# Patient Record
Sex: Female | Born: 1975 | Race: Black or African American | Hispanic: No | Marital: Single | Smoking: Never smoker
Health system: Southern US, Community
[De-identification: ages and names within clinical notes are randomized; demographics above are authoritative.]

## PROBLEM LIST (undated history)

## (undated) DIAGNOSIS — J45909 Unspecified asthma, uncomplicated: Secondary | ICD-10-CM

## (undated) HISTORY — PX: TUBAL LIGATION: SHX77

## (undated) HISTORY — DX: Unspecified asthma, uncomplicated: J45.909

---

## 2020-07-20 ENCOUNTER — Emergency Department (HOSPITAL_COMMUNITY)
Admission: EM | Admit: 2020-07-20 | Discharge: 2020-07-20 | Disposition: A | Discharge status: Home / Self Care | Payer: No Typology Code available for payment source | Attending: Emergency Medicine | Admitting: Emergency Medicine

## 2020-07-20 ENCOUNTER — Other Ambulatory Visit: Payer: Self-pay

## 2020-07-20 ENCOUNTER — Encounter (HOSPITAL_COMMUNITY): Payer: Self-pay | Admitting: *Deleted

## 2020-07-20 ENCOUNTER — Emergency Department (HOSPITAL_COMMUNITY): Payer: No Typology Code available for payment source

## 2020-07-20 DIAGNOSIS — Z28310 Unvaccinated for covid-19: Secondary | ICD-10-CM | POA: Diagnosis not present

## 2020-07-20 DIAGNOSIS — J4521 Mild intermittent asthma with (acute) exacerbation: Secondary | ICD-10-CM | POA: Diagnosis not present

## 2020-07-20 DIAGNOSIS — R079 Chest pain, unspecified: Secondary | ICD-10-CM | POA: Diagnosis present

## 2020-07-20 MED ORDER — PREDNISONE 20 MG PO TABS: ORAL_TABLET | ORAL | 0 refills | Status: DC

## 2020-07-20 MED ORDER — ALBUTEROL SULFATE HFA 108 (90 BASE) MCG/ACT IN AERS
2.0000 | INHALATION_SPRAY | RESPIRATORY_TRACT | Status: DC | PRN
  Administered 2020-07-20: 2 via RESPIRATORY_TRACT
  Filled 2020-07-20: qty 6.7

## 2020-07-20 MED ORDER — PREDNISONE 20 MG PO TABS
60.0000 mg | ORAL_TABLET | Freq: Once | ORAL | Status: AC
  Administered 2020-07-20: 60 mg via ORAL
  Filled 2020-07-20: qty 3

## 2020-07-20 MED ORDER — BENZONATATE 100 MG PO CAPS: 100.0000 mg | ORAL_CAPSULE | Freq: Three times a day (TID) | ORAL | 0 refills | Status: AC

## 2020-07-20 NOTE — Discharge Instructions (Addendum)
You have symptom is likely due to an asthma exacerbation.  Take steroid as prescribed.  Take cough medication as needed.  Use albuterol inhaler 2 puffs every 4 hours as needed for shortness of breath.  A COVID test have been obtained, please check MyChart, link below for results.  Your chest x-ray today did not show any signs of pneumonia

## 2020-07-20 NOTE — ED Provider Notes (Signed)
[facility]-EMERGENCY DEPT Provider Note   CSN: [id number] Arrival date & time: 07/20/20  0840     History Chief Complaint  Patient presents with   Chest Pain   Wheezing    Terry Williams is a 45 y.o. female.  The history is provided by the patient. No language interpreter was used.  Chest Pain Wheezing Associated symptoms: chest pain    45 year old female significant history of chronic bronchitis who presents for evaluation of chest pain and wheezing.  Patient states the symptoms started this morning.  States she has been coughing, it hurts in her chest when she coughs she also endorsing wheezing.  Symptoms felt similar to prior asthma exacerbation that she had in the past.  She denies having fever chills congestion sore throat loss of taste or smell nausea vomiting diarrhea or significant shortness of breath.  She did try using her rescue inhaler today with minimal improvement.  She has not been vaccinated for COVID-19.  She denies any cardiac history  History reviewed. No pertinent past medical history.  There are no problems to display for this patient.   History reviewed. No pertinent surgical history.   OB History   No obstetric history on file.     No family history on file.     Home Medications Prior to Admission medications   Not on File    Allergies    Patient has no allergy information on record.  Review of Systems   Review of Systems  Respiratory:  Positive for wheezing.   Cardiovascular:  Positive for chest pain.  All other systems reviewed and are negative.  Physical Exam Updated Vital Signs BP (!) 141/100 (BP Location: Right Arm)   Pulse 73   Temp 99 F (37.2 C) (Oral)   Resp 18   Ht 5\' 1"  (1.549 m)   Wt 54.4 kg   SpO2 100%   BMI 22.67 kg/m   Physical Exam Vitals and nursing note reviewed.  Constitutional:      General: She is not in acute distress.    Appearance: She is well-developed.  HENT:     Head:  Atraumatic.     Nose: Nose normal.     Mouth/Throat:     Mouth: Mucous membranes are moist.  Eyes:     Conjunctiva/sclera: Conjunctivae normal.  Cardiovascular:     Rate and Rhythm: Normal rate and regular rhythm.  Pulmonary:     Effort: Pulmonary effort is normal.     Breath sounds: Wheezing (Faint wheeze heard) present.  Abdominal:     General: Abdomen is flat.     Tenderness: There is no abdominal tenderness.  Musculoskeletal:     Cervical back: Neck supple.  Skin:    Findings: No rash.  Neurological:     Mental Status: She is alert.  Psychiatric:        Mood and Affect: Mood normal.    ED Results / Procedures / Treatments   Labs (all labs ordered are listed, but only abnormal results are displayed) Labs Reviewed  BASIC METABOLIC PANEL  CBC  I-STAT BETA HCG BLOOD, ED ([facility], [facility], [facility] ONLY)  TROPONIN I (HIGH SENSITIVITY)    EKG None  Radiology No results found.  Procedures Procedures   Medications Ordered in ED Medications - No data to display  ED Course  I have reviewed the triage vital signs and the nursing notes.  Pertinent labs & imaging results that were available during my care of the patient were  reviewed by me and considered in my medical decision making (see chart for details).    MDM Rules/Calculators/A&P                          BP (!) 141/100 (BP Location: Right Arm)   Pulse 73   Temp 99 F (37.2 C) (Oral)   Resp 18   Ht 5\' 1"  (1.549 m)   Wt 54.4 kg   SpO2 100%   BMI 22.67 kg/m   Final Clinical Impression(s) / ED Diagnoses Final diagnoses:  Mild intermittent asthma with exacerbation    Rx / DC Orders ED Discharge Orders          Ordered    predniSONE (DELTASONE) 20 MG tablet        07/20/20 1048    benzonatate (TESSALON) 100 MG capsule  Every 8 hours        07/20/20 1048           9:45 AM Patient with history of asthma who is here with complaints of chest pain wheezes and persistent cough.  In the room she coughed multiple  times, and she does have some faint wheezes heard however she is not hypoxic and in no acute respiratory discomfort.  I suspect this is more likely to be asthma exacerbation.  I have very low suspicion for ACS or PE.  Will obtain COVID test as patient has not been vaccinated and is here with wheezing cough.  Anticipate discharging home with albuterol inhaler, steroids, cough medication and patient will need to follow-up on her COVID result   07/22/20, PA-C 07/20/20 1050    07/22/20, MD 07/20/20 [phone]

## 2020-07-20 NOTE — ED Triage Notes (Signed)
Pt complains of chest pain, left arm pain, and wheezing since this morning. Hx of chronic bronchitis.

## 2020-09-20 ENCOUNTER — Other Ambulatory Visit: Payer: Self-pay

## 2020-09-20 ENCOUNTER — Encounter (HOSPITAL_COMMUNITY): Payer: Self-pay

## 2020-09-20 ENCOUNTER — Emergency Department (HOSPITAL_COMMUNITY)
Admission: EM | Admit: 2020-09-20 | Discharge: 2020-09-20 | Disposition: A | Discharge status: Home / Self Care | Payer: No Typology Code available for payment source | Attending: Emergency Medicine | Admitting: Emergency Medicine

## 2020-09-20 DIAGNOSIS — M5412 Radiculopathy, cervical region: Secondary | ICD-10-CM | POA: Diagnosis not present

## 2020-09-20 DIAGNOSIS — M79602 Pain in left arm: Secondary | ICD-10-CM | POA: Diagnosis present

## 2020-09-20 MED ORDER — PREDNISONE 10 MG (21) PO TBPK: ORAL_TABLET | Freq: Every day | ORAL | 0 refills | Status: DC

## 2020-09-20 NOTE — ED Triage Notes (Signed)
Pt presents to the ED for left arm pain, burning, and numbness. Pt reports a hx of spinal stenosis and was prescribed Gabapentin for her sx and states they are "not working."

## 2020-09-20 NOTE — Discharge Instructions (Addendum)
I prescribed you a prednisone taper.  Please take this as prescribed.  When you are finished with this taper you can resume your 10 mg of daily prednisone for her asthma.  Please follow-up with Dr. Wilford Corner regarding your symptoms and work to schedule an MRI of your neck.  If you develop any worsening tingling, numbness, pain, weakness, please come back to the emergency department immediately for reevaluation.  It was a pleasure to meet you.

## 2020-09-20 NOTE — ED Provider Notes (Signed)
[facility]-EMERGENCY DEPT Provider Note   CSN: [id number] Arrival date & time: 09/20/20  1007     History Chief Complaint  Patient presents with   left arm pain    and numbness    Doriana Mazurkiewicz is a 45 y.o. female.  HPI Patient is a 45 year old female who presents to the emergency department due to burning pain and tingling in the bilateral arms, left greater than right.  Patient states that her symptoms have been intermittent for the past few months and yesterday she began experiencing more constant symptoms in the arm.  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 Bilateral mild neural foraminal narrowing. No acute fracture or dislocation seen.   She then followed up with Dr. Wilford Corner with orthopedics 4 days ago.  Patient had an MRI previously of her cervical spine and has known stenosis which is most significant at C5-6.  They are working to schedule a new MRI of the cervical spine and patient was started on gabapentin 300 mg twice daily.  Patient states she has been taking this medication without significant relief of her symptoms.  Denies any weakness or bowel/bladder incontinence.    History reviewed. No pertinent past medical history.  There are no problems to display for this patient.   History reviewed. No pertinent surgical history.   OB History   No obstetric history on file.     History reviewed. No pertinent family history.     Home Medications Prior to Admission medications   Medication Sig Start Date End Date Taking? Authorizing Provider  predniSONE (STERAPRED UNI-PAK 21 TAB) 10 MG (21) TBPK tablet Take by mouth daily. Take 6 tabs by mouth daily  for 2 days, then 5 tabs for 2 days, then 4 tabs for 2 days, then 3 tabs for 2 days, 2 tabs for 2 days, then 1 tab by mouth daily for 2 days  09/20/20  Yes Placido Sou, PA-C  benzonatate (TESSALON) 100 MG capsule Take 1 capsule (100 mg total) by mouth every 8 (eight) hours. 07/20/20   Fayrene Helper, PA-C    Allergies    Patient has no allergy information on record.  Review of Systems   Review of Systems  Musculoskeletal:  Positive for myalgias and neck pain.  Neurological:  Positive for numbness. Negative for weakness.   Physical Exam Updated Vital Signs BP (!) 145/109 (BP Location: Left Arm)   Pulse 70   Temp 97.9 F (36.6 C) (Oral)   Ht 5\' 1"  (1.549 m)   Wt 56.7 kg   SpO2 100%   BMI 23.62 kg/m   Physical Exam Vitals and nursing note reviewed.  Constitutional:      General: She is not in acute distress.    Appearance: She is well-developed.  HENT:     Head: Normocephalic and atraumatic.     Right Ear: External ear normal.     Left Ear: External ear normal.  Eyes:     General: No scleral icterus.       Right eye: No discharge.        Left eye: No discharge.     Conjunctiva/sclera: Conjunctivae normal.  Neck:     Trachea: No tracheal deviation.     Comments: Mild tenderness noted diffusely along the cervical spine that appears to be worst along the left lateral cervical  paraspinal musculature. Cardiovascular:     Rate and Rhythm: Normal rate.  Pulmonary:     Effort: Pulmonary effort is normal. No respiratory distress.     Breath sounds: No stridor.  Abdominal:     General: There is no distension.  Musculoskeletal:        General: No swelling or deformity.     Cervical back: Neck supple. Tenderness present.     Comments: Full range of motion of the bilateral upper extremities at the shoulders, elbows, and wrists.  Skin:    General: Skin is warm and dry.     Findings: No rash.  Neurological:     General: No focal deficit present.     Mental Status: She is alert and oriented to person, place, and time.     Cranial Nerves: Cranial nerve deficit: no gross deficits.     Comments: Strength is 5/5 in the  bilateral upper extremities.  Distal sensation intact to light touch.  Patient able to discriminate between sharp and dull.  Grip strength intact.  No gross deficits.    ED Results / Procedures / Treatments   Labs (all labs ordered are listed, but only abnormal results are displayed) Labs Reviewed - No data to display  EKG None  Radiology No results found.  Procedures Procedures   Medications Ordered in ED Medications - No data to display  ED Course  I have reviewed the triage vital signs and the nursing notes.  Pertinent labs & imaging results that were available during my care of the patient were reviewed by me and considered in my medical decision making (see chart for details).    MDM Rules/Calculators/A&P                          Patient is a 45 year old female who presents to the emergency department with what appears to be a cervical radiculopathy.  Patient evaluated by orthopedics 4 days ago and started on gabapentin twice daily which she states is not providing relief.  Physical exam significant for tenderness along the left lateral cervical spine.  Patient has full range of motion of the bilateral upper extremities.  Strength is intact bilaterally.  Distal sensation intact.  Patient able to discriminate between sharp and dull sensation.  No gross deficits on my exam.  We will start patient on a prednisone taper.  Recommended that she follow-up with orthopedics.  They are currently working to schedule an MRI of her cervical spine.  Patient understands to return to the emergency department with worsening weakness, numbness, bowel or bladder incontinence, or generally worsening symptoms.  Feel that she is stable for discharge at this time and she is agreeable.  Her questions were answered and she was amicable at the time of discharge.  Final Clinical Impression(s) / ED Diagnoses Final diagnoses:  Radiculopathy, unspecified spinal region   Rx / DC Orders ED Discharge  Orders          Ordered    predniSONE (STERAPRED UNI-PAK 21 TAB) 10 MG (21) TBPK tablet  Daily        09/20/20 1313             Placido Sou, PA-C 09/20/20 1323    Mancel Bale, MD 09/21/20 1054

## 2020-11-01 ENCOUNTER — Emergency Department (HOSPITAL_COMMUNITY)
Admission: EM | Admit: 2020-11-01 | Discharge: 2020-11-01 | Discharge status: Against Medical Advice | Payer: No Typology Code available for payment source | Attending: Emergency Medicine | Admitting: Emergency Medicine

## 2020-11-01 ENCOUNTER — Emergency Department (HOSPITAL_COMMUNITY): Payer: No Typology Code available for payment source

## 2020-11-01 ENCOUNTER — Other Ambulatory Visit: Payer: Self-pay

## 2020-11-01 ENCOUNTER — Encounter (HOSPITAL_COMMUNITY): Payer: Self-pay | Admitting: Emergency Medicine

## 2020-11-01 DIAGNOSIS — R0602 Shortness of breath: Secondary | ICD-10-CM | POA: Diagnosis not present

## 2020-11-01 DIAGNOSIS — R0789 Other chest pain: Secondary | ICD-10-CM | POA: Diagnosis not present

## 2020-11-01 LAB — CBC
HCT: 39.1 % (ref 36.0–46.0)
Hemoglobin: 12.6 g/dL (ref 12.0–15.0)
MCH: 27.9 pg (ref 26.0–34.0)
MCHC: 32.2 g/dL (ref 30.0–36.0)
MCV: 86.7 fL (ref 80.0–100.0)
Platelets: 308 10*3/uL (ref 150–400)
RBC: 4.51 MIL/uL (ref 3.87–5.11)
RDW: 13.2 % (ref 11.5–15.5)
WBC: 6.2 10*3/uL (ref 4.0–10.5)
nRBC: 0 % (ref 0.0–0.2)

## 2020-11-01 LAB — BASIC METABOLIC PANEL
Anion gap: 5 (ref 5–15)
BUN: 20 mg/dL (ref 6–20)
CO2: 25 mmol/L (ref 22–32)
Calcium: 9.4 mg/dL (ref 8.9–10.3)
Chloride: 107 mmol/L (ref 98–111)
Creatinine, Ser: 0.82 mg/dL (ref 0.44–1.00)
GFR, Estimated: >60 mL/min (ref >60)
Glucose, Bld: 189 mg/dL — ABNORMAL HIGH (ref 70–99)
Potassium: 3.6 mmol/L (ref 3.5–5.1)
Sodium: 137 mmol/L (ref 135–145)

## 2020-11-01 LAB — HCG, QUANTITATIVE, PREGNANCY: hCG, Beta Chain, Quant, S: 1 m[IU]/mL (ref <5)

## 2020-11-01 LAB — TROPONIN I (HIGH SENSITIVITY): Troponin I (High Sensitivity): 3 ng/L (ref <18)

## 2020-11-01 MED ORDER — ASPIRIN 81 MG PO CHEW: 324.0000 mg | CHEWABLE_TABLET | Freq: Once | ORAL | Status: DC

## 2020-11-01 NOTE — ED Notes (Signed)
Pt not in room.

## 2020-11-01 NOTE — ED Notes (Signed)
Pt is nowhere to be found 

## 2020-11-01 NOTE — ED Provider Notes (Signed)
[facility]-EMERGENCY DEPT Provider Note   CSN: [id number] Arrival date & time: 11/01/20  1704     History Chief Complaint  Patient presents with   Chest Pain   Shortness of Breath    Terry Williams is a 45 y.o. female.  She has no significant past medical history.  Complaining of sharp central chest pain and difficulty taking a deep breath that started about an hour prior to arrival.  She denies any trauma.  No history of having these symptoms before.  Denies any tobacco or drugs.  Has family history of heart disease but no personal history.  Has tried nothing for it.  The history is provided by the patient.  Chest Pain Pain location:  Substernal area Pain quality: sharp and stabbing   Pain radiates to:  Does not radiate Pain severity:  Severe Onset quality:  Sudden Duration:  3 hours Timing:  Constant Progression:  Unchanged Chronicity:  New Context: at rest   Relieved by:  None tried Worsened by:  Deep breathing and movement Ineffective treatments:  None tried Associated symptoms: shortness of breath   Associated symptoms: no abdominal pain, no back pain, no cough, no diaphoresis, no dysphagia, no fever, no headache, no lower extremity edema, no nausea and no vomiting   Shortness of Breath Associated symptoms: chest pain   Associated symptoms: no abdominal pain, no cough, no diaphoresis, no fever, no headaches, no rash, no sore throat and no vomiting    HPI: A 45 year old patient presents for evaluation of chest pain. Initial onset of pain was approximately 3-6 hours ago. The patient's chest pain is sharp and is not worse with exertion. The patient's chest pain is middle- or left-sided, is not well-localized, is not described as heaviness/pressure/tightness and does not radiate to the arms/jaw/neck. The patient does not complain of nausea and denies diaphoresis. The patient has a family history of coronary artery disease in a first-degree relative with  onset less than age 9. The patient has no history of stroke, has no history of peripheral artery disease, has not smoked in the past 90 days, denies any history of treated diabetes, is not hypertensive, has no history of hypercholesterolemia and does not have an elevated BMI (>=30).   History reviewed. No pertinent past medical history.  There are no problems to display for this patient.   History reviewed. No pertinent surgical history.   OB History   No obstetric history on file.     No family history on file.  Social History   Tobacco Use   Smoking status: Never   Smokeless tobacco: Never    Home Medications Prior to Admission medications   Medication Sig Start Date End Date Taking? Authorizing Provider  benzonatate (TESSALON) 100 MG capsule Take 1 capsule (100 mg total) by mouth every 8 (eight) hours. 07/20/20   Fayrene Helper, PA-C  predniSONE (STERAPRED UNI-PAK 21 TAB) 10 MG (21) TBPK tablet Take by mouth daily. Take 6 tabs by mouth daily  for 2 days, then 5 tabs for 2 days, then 4 tabs for 2 days, then 3 tabs for 2 days, 2 tabs for 2 days, then 1 tab by mouth daily for 2 days 09/20/20   Placido Sou, PA-C    Allergies    Patient has no allergy information on record.  Review of Systems   Review of Systems  Constitutional:  Negative for diaphoresis and fever.  HENT:  Negative for sore throat and trouble swallowing.   Eyes:  Negative for visual disturbance.  Respiratory:  Positive for shortness of breath. Negative for cough.   Cardiovascular:  Positive for chest pain.  Gastrointestinal:  Negative for abdominal pain, nausea and vomiting.  Genitourinary:  Negative for dysuria.  Musculoskeletal:  Negative for back pain.  Skin:  Negative for rash.  Neurological:  Negative for headaches.   Physical Exam Updated Vital Signs BP 136/76 (BP Location: Left Arm)   Pulse 67   Temp 98 F (36.7 C) (Oral)   Resp 16   SpO2 100%   Physical Exam Vitals and nursing note  reviewed.  Constitutional:      General: She is not in acute distress.    Appearance: She is well-developed.  HENT:     Head: Normocephalic and atraumatic.  Eyes:     Conjunctiva/sclera: Conjunctivae normal.  Cardiovascular:     Rate and Rhythm: Normal rate and regular rhythm.     Heart sounds: Normal heart sounds. No murmur heard. Pulmonary:     Effort: Pulmonary effort is normal. No respiratory distress.     Breath sounds: Normal breath sounds.  Chest:     Chest wall: No tenderness.  Abdominal:     Palpations: Abdomen is soft. There is no mass.     Tenderness: There is no abdominal tenderness.  Musculoskeletal:     Cervical back: Neck supple.  Skin:    General: Skin is warm and dry.  Neurological:     General: No focal deficit present.     Mental Status: She is alert.     Motor: No weakness.    ED Results / Procedures / Treatments   Labs (all labs ordered are listed, but only abnormal results are displayed) Labs Reviewed  BASIC METABOLIC PANEL - Abnormal; Notable for the following components:      Result Value   Glucose, Bld 189 (*)    All other components within normal limits  CBC  HCG, QUANTITATIVE, PREGNANCY  TROPONIN I (HIGH SENSITIVITY)  TROPONIN I (HIGH SENSITIVITY)    EKG EKG Interpretation  Date/Time:  Sunday November 01 2020 17:12:53 EDT Ventricular Rate:  68 PR Interval:  164 QRS Duration: 98 QT Interval:  394 QTC Calculation: 419 R Axis:   72 Text Interpretation: Sinus rhythm Probable left atrial enlargement Left ventricular hypertrophy No old tracing to compare Confirmed by Meridee Score [phone]) on 11/01/2020 5:15:27 PM  Radiology DG Chest 2 View  Result Date: 11/01/2020 CLINICAL DATA:  Chest pain and shortness of breath EXAM: CHEST - 2 VIEW COMPARISON:  07/20/2020 FINDINGS: The heart size and mediastinal contours are within normal limits. No focal airspace consolidation, pleural effusion, or pneumothorax. The visualized skeletal structures  are unremarkable. IMPRESSION: No active cardiopulmonary disease. Electronically Signed   By: Duanne Guess D.O.   On: 11/01/2020 17:38    Procedures Procedures   Medications Ordered in ED Medications  aspirin chewable tablet 324 mg (has no administration in time range)    ED Course  I have reviewed the triage vital signs and the nursing notes.  Pertinent labs & imaging results that were available during my care of the patient were reviewed by me and considered in my medical decision making (see chart for details).  Clinical Course as of 11/02/20 1035  Sun Nov 01, 2020  2009 I was informed by the nurse that the patient is not in the room.  Question eloped. [MB]    Clinical Course User Index [MB] Terrilee Files, MD   MDM Rules/Calculators/A&P [facility]  Score: 3                        This patient complains of chest pain shortness of breath; this involves an extensive number of treatment Options and is a complaint that carries with it a high risk of complications and Morbidity. The differential includes ACS, pneumonia, pneumothorax, PE, DVT, musculoskeletal, GERD  I ordered, reviewed and interpreted labs, which included CBC with normal white count normal hemoglobin, chemistries normal other than elevated glucose, pregnancy test negative, initial troponin negative.  I have ordered a D-dimer and troponin but these were not completed due to patient elopement I ordered imaging studies which included chest x-ray and I independently    visualized and interpreted imaging which showed no acute findings  Previous records obtained and reviewed in epic no recent visits  After the interventions stated above, I reevaluated the patient and found patient to have eloped.   Final Clinical Impression(s) / ED Diagnoses Final diagnoses:  Atypical chest pain    Rx / DC Orders ED Discharge Orders     None        Terrilee Files, MD 11/02/20 1038

## 2020-11-01 NOTE — ED Triage Notes (Signed)
Patient here from home reporting central chest pain non-radiating and SOB that started 30 min ago.

## 2020-11-01 NOTE — ED Notes (Signed)
Pt is not in the room, went in to see patient at 1910 to introduce self, pt not there, came back at 1930 still not there, pt is not in any of the bathrooms, called patient which did not answer phone, checked again at 2000 and pt is not there.

## 2020-11-01 NOTE — ED Provider Notes (Signed)
Emergency Medicine Provider Triage Evaluation Note  Terry Williams , a 45 y.o. female  was evaluated in triage.  Pt complains of chest pain and shortness of breath that started 30 minutes ago.  No prior cardiac history.  Patient states that the pain feels sharp and is in the middle of her chest and it does not radiate.  She also states that its been harder to catch her breath.  Never had similar symptoms before.  Review of Systems  Positive: CP, SOB Negative: Fevers, chills, abdominal pain, N/V  Physical Exam  BP 136/76 (BP Location: Left Arm)   Pulse 67   Temp 98 F (36.7 C) (Oral)   Resp 16   SpO2 100%  Gen:   Awake, no distress   Resp:  Normal effort  MSK:   Moves extremities without difficulty  Other:    Medical Decision Making  Medically screening exam initiated at 5:17 PM.  Appropriate orders placed.  Terry Williams was informed that the remainder of the evaluation will be completed by another provider, this initial triage assessment does not replace that evaluation, and the importance of remaining in the ED until their evaluation is complete.     Terry Williams 11/01/20 1718    Koleen Distance, MD 11/01/20 [phone]

## 2021-03-08 ENCOUNTER — Other Ambulatory Visit: Payer: Self-pay

## 2021-03-08 ENCOUNTER — Encounter: Payer: Self-pay | Admitting: Nurse Practitioner

## 2021-03-08 ENCOUNTER — Ambulatory Visit (INDEPENDENT_AMBULATORY_CARE_PROVIDER_SITE_OTHER): Payer: 59 | Admitting: Nurse Practitioner

## 2021-03-08 VITALS — BP 116/78 | HR 74 | Temp 98.2°F | Ht 61.0 in | Wt 116.1 lb

## 2021-03-08 DIAGNOSIS — Z7689 Persons encountering health services in other specified circumstances: Secondary | ICD-10-CM

## 2021-03-08 DIAGNOSIS — J4520 Mild intermittent asthma, uncomplicated: Secondary | ICD-10-CM | POA: Diagnosis not present

## 2021-03-08 DIAGNOSIS — M4722 Other spondylosis with radiculopathy, cervical region: Secondary | ICD-10-CM

## 2021-03-08 MED ORDER — PREDNISONE 10 MG (48) PO TBPK: ORAL_TABLET | ORAL | 0 refills | Status: DC

## 2021-03-08 MED ORDER — GABAPENTIN 100 MG PO CAPS: ORAL_CAPSULE | ORAL | 1 refills | Status: AC

## 2021-03-08 MED ORDER — ADVAIR HFA 45-21 MCG/ACT IN AERO: 2.0000 | INHALATION_SPRAY | Freq: Two times a day (BID) | RESPIRATORY_TRACT | 12 refills | Status: AC
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
## 2021-03-14 DIAGNOSIS — J4520 Mild intermittent asthma, uncomplicated: Secondary | ICD-10-CM | POA: Insufficient documentation

## 2021-03-14 DIAGNOSIS — M4722 Other spondylosis with radiculopathy, cervical region: Secondary | ICD-10-CM | POA: Insufficient documentation

## 2021-03-17 ENCOUNTER — Emergency Department (HOSPITAL_COMMUNITY)
Admission: EM | Admit: 2021-03-17 | Discharge: 2021-03-17 | Disposition: A | Discharge status: Home / Self Care | Payer: 59 | Attending: Emergency Medicine | Admitting: Emergency Medicine

## 2021-03-17 ENCOUNTER — Encounter (HOSPITAL_COMMUNITY): Payer: Self-pay

## 2021-03-17 DIAGNOSIS — R0600 Dyspnea, unspecified: Secondary | ICD-10-CM | POA: Diagnosis present

## 2021-03-17 DIAGNOSIS — J45909 Unspecified asthma, uncomplicated: Secondary | ICD-10-CM | POA: Insufficient documentation

## 2021-03-17 DIAGNOSIS — R0602 Shortness of breath: Secondary | ICD-10-CM | POA: Insufficient documentation

## 2021-03-17 DIAGNOSIS — R059 Cough, unspecified: Secondary | ICD-10-CM | POA: Diagnosis not present

## 2021-03-17 DIAGNOSIS — R000 Tachycardia, unspecified: Secondary | ICD-10-CM | POA: Insufficient documentation

## 2021-03-17 DIAGNOSIS — R062 Wheezing: Secondary | ICD-10-CM | POA: Diagnosis not present

## 2021-03-17 DIAGNOSIS — Z7952 Long term (current) use of systemic steroids: Secondary | ICD-10-CM | POA: Insufficient documentation

## 2021-03-17 DIAGNOSIS — Z20822 Contact with and (suspected) exposure to covid-19: Secondary | ICD-10-CM | POA: Insufficient documentation

## 2021-03-17 LAB — RESP PANEL BY RT-PCR (FLU A&B, COVID) ARPGX2
Influenza A by PCR: NEGATIVE
Influenza B by PCR: NEGATIVE
SARS Coronavirus 2 by RT PCR: NEGATIVE

## 2021-03-17 MED ORDER — PREDNISONE 20 MG PO TABS: 40.0000 mg | ORAL_TABLET | Freq: Every day | ORAL | 0 refills | Status: AC

## 2021-03-17 MED ORDER — ALBUTEROL SULFATE HFA 108 (90 BASE) MCG/ACT IN AERS
2.0000 | INHALATION_SPRAY | Freq: Four times a day (QID) | RESPIRATORY_TRACT | Status: DC
  Administered 2021-03-17: 2 via RESPIRATORY_TRACT
  Filled 2021-03-17: qty 6.7

## 2021-03-17 MED ORDER — PREDNISONE 20 MG PO TABS
60.0000 mg | ORAL_TABLET | ORAL | Status: AC
  Administered 2021-03-17: 60 mg via ORAL
  Filled 2021-03-17: qty 3

## 2021-03-17 MED ORDER — IPRATROPIUM-ALBUTEROL 0.5-2.5 (3) MG/3ML IN SOLN
3.0000 mL | Freq: Once | RESPIRATORY_TRACT | Status: AC
  Administered 2021-03-17: 3 mL via RESPIRATORY_TRACT
  Filled 2021-03-17: qty 3

## 2021-03-17 NOTE — ED Provider Notes (Signed)
[facility]-EMERGENCY DEPT Provider Note   CSN: [id number] Arrival date & time: 03/17/21  1113     History  Chief Complaint  Patient presents with   Cough   Wheezing    Terry Williams is a 46 y.o. female.  HPI Patient with a history of asthma presents with dyspnea, wheezing, cough.  She is a non-smoker, notes that she has nearly completed a taper of steroids, has been using her home inhaler as instructed, and had been improving after an exacerbation about 1 week ago.  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  No pain, fever, vomiting, nausea or other complaints.    Home Medications Prior to Admission medications   Medication Sig Start Date End Date Taking? Authorizing Provider  benzonatate (TESSALON) 100 MG capsule Take 1 capsule (100 mg total) by mouth every 8 (eight) hours. 07/20/20   Fayrene Helper, PA-C  fluticasone-salmeterol (ADVAIR HFA) [phone] MCG/ACT inhaler Inhale 2 puffs into the lungs 2 (two) times daily. 03/08/21   Carlean Jews, NP  gabapentin (NEURONTIN) 100 MG capsule May take 1 capsule po QHS at bedtime for one week then  increase to 2 capsules po QHS 03/08/21   Carlean Jews, NP  predniSONE (STERAPRED UNI-PAK 48 TAB) 10 MG (48) TBPK tablet 12 day taper - take by mouth as directed for 12 days 03/08/21   Carlean Jews, NP      Allergies    Promethazine and Naproxen    Review of Systems   Review of Systems  Constitutional:        Per HPI, otherwise negative  HENT:         Per HPI, otherwise negative  Respiratory:         Per HPI, otherwise negative  Cardiovascular:        Per HPI, otherwise negative  Gastrointestinal:  Negative for vomiting.  Endocrine:       Negative aside from HPI  Genitourinary:        Neg aside from HPI   Musculoskeletal:        Per HPI, otherwise negative  Skin: Negative.   Neurological:  Negative for syncope.   Physical  Exam Updated Vital Signs BP (!) 145/97 (BP Location: Right Arm)    Pulse 78    Temp 98.4 F (36.9 C) (Oral)    Resp 18    SpO2 100%  Physical Exam Vitals and nursing note reviewed.  Constitutional:      General: She is not in acute distress.    Appearance: She is well-developed.  HENT:     Head: Normocephalic and atraumatic.  Eyes:     Conjunctiva/sclera: Conjunctivae normal.  Cardiovascular:     Rate and Rhythm: Regular rhythm. Tachycardia present.  Pulmonary:     Breath sounds: Decreased air movement present.  Abdominal:     General: There is no distension.  Skin:    General: Skin is warm and dry.  Neurological:     Mental Status: She is alert and oriented to person, place, and time.     Cranial Nerves: No cranial nerve deficit.    ED Results / Procedures / Treatments   Labs (all labs ordered are listed, but only abnormal results are displayed) Labs Reviewed  RESP PANEL BY RT-PCR (FLU A&B, COVID) [id number]    EKG None  Radiology No results found.  Procedures Procedures    Medications Ordered  in ED Medications  ipratropium-albuterol (DUONEB) 0.5-2.5 (3) MG/3ML nebulizer solution 3 mL (has no administration in time range)  predniSONE (DELTASONE) tablet 60 mg (has no administration in time range)    ED Course/ Medical Decision Making/ A&P  This patient presents to the ED for concern of dyspnea, cough, this involves an extensive number of treatment options, and is a complaint that carries with it a high risk of complications and morbidity.  The differential diagnosis includes COVID, asthma exacerbation, pneumonia, bacteremia, sepsis   Co morbidities that complicate the patient evaluation  Asthma   Social Determinants of Health:  None   Additional history obtained:  Additional history and/or information obtained from chart review External records from outside source obtained and reviewed including Primary care visit note from end of last month with  initiation of new primary care, reassuring   After the initial evaluation, orders, including: Bronchodilator, COVID test, steroids were initiated.  Patient placed on Cardiac and Pulse-Oximetry Monitors. The patient was maintained on a cardiac monitor.  The cardiac monitored showed an rhythm of sinus 80 unremarkable The patient was also maintained on pulse oximetry. The readings were typically 96% room air borderline  On repeat evaluation of the patient improved  Lab Tests:  I personally interpreted labs.  The pertinent results include: Negative COVID test negative influenza    Dispostion / Final MDM:  After consideration of the diagnostic results and the patient's response to treatment, patient is appropriate for discharge with increased steroid dosing, scheduled bronchodilator use, outpatient follow-up.  No early for bacteremia, sepsis, low suspicion for pneumonia given the palpable breath sounds bilaterally, absence of fever.  Patient received first dose of his steroids in the ED after DuoNeb, and was provided albuterol inhaler as she was seemingly only on Advair to go with her steroids previously.   Final Clinical Impression(s) / ED Diagnoses Final diagnoses:  SOB (shortness of breath)    Rx / DC Orders ED Discharge Orders          Ordered    predniSONE (DELTASONE) 20 MG tablet  Daily with breakfast        03/17/21 1345              Gerhard Munch, MD 03/17/21 1345

## 2021-03-17 NOTE — ED Triage Notes (Signed)
Pt presents with c/o cough and wheezing. Pt does have asthma, reports that she believes her bronchitis may be flaring up.

## 2021-03-17 NOTE — Discharge Instructions (Addendum)
As discussed, your evaluation today has been largely reassuring.  But, it is important that you monitor your condition carefully, and do not hesitate to return to the ED if you develop new, or concerning changes in your condition.  Otherwise, please follow-up with your physician for appropriate ongoing care.  For the next 2 days please use the provided albuterol inhaler every 4 hours in addition to your Advair.  You may then use the albuterol as needed for additional relief.  Please obtain and take your new dose of steroids and discontinue the Sterapred Unipak.

## 2021-03-24 ENCOUNTER — Other Ambulatory Visit: Payer: Self-pay

## 2021-03-24 ENCOUNTER — Ambulatory Visit (HOSPITAL_BASED_OUTPATIENT_CLINIC_OR_DEPARTMENT_OTHER): Payer: Self-pay | Admitting: Family Medicine

## 2021-03-24 DIAGNOSIS — Z130 Encounter for screening for diseases of the blood and blood-forming organs and certain disorders involving the immune mechanism: Secondary | ICD-10-CM

## 2021-03-24 DIAGNOSIS — Z7689 Persons encountering health services in other specified circumstances: Secondary | ICD-10-CM

## 2021-03-24 DIAGNOSIS — Z13228 Encounter for screening for other metabolic disorders: Secondary | ICD-10-CM

## 2021-03-26 ENCOUNTER — Other Ambulatory Visit: Payer: 59

## 2021-04-02 ENCOUNTER — Encounter: Payer: 59 | Admitting: Nurse Practitioner

## 2021-04-16 ENCOUNTER — Encounter (HOSPITAL_BASED_OUTPATIENT_CLINIC_OR_DEPARTMENT_OTHER): Payer: Self-pay | Admitting: Family Medicine

## 2021-05-25 ENCOUNTER — Emergency Department (HOSPITAL_BASED_OUTPATIENT_CLINIC_OR_DEPARTMENT_OTHER): Payer: 59 | Admitting: Radiology

## 2021-05-25 ENCOUNTER — Other Ambulatory Visit: Payer: Self-pay

## 2021-05-25 ENCOUNTER — Emergency Department (HOSPITAL_BASED_OUTPATIENT_CLINIC_OR_DEPARTMENT_OTHER)
Admission: EM | Admit: 2021-05-25 | Discharge: 2021-05-25 | Disposition: A | Discharge status: Home / Self Care | Payer: 59 | Attending: Emergency Medicine | Admitting: Emergency Medicine

## 2021-05-25 ENCOUNTER — Encounter (HOSPITAL_BASED_OUTPATIENT_CLINIC_OR_DEPARTMENT_OTHER): Payer: Self-pay | Admitting: Obstetrics and Gynecology

## 2021-05-25 DIAGNOSIS — J4521 Mild intermittent asthma with (acute) exacerbation: Secondary | ICD-10-CM | POA: Insufficient documentation

## 2021-05-25 DIAGNOSIS — J40 Bronchitis, not specified as acute or chronic: Secondary | ICD-10-CM | POA: Insufficient documentation

## 2021-05-25 DIAGNOSIS — Z7951 Long term (current) use of inhaled steroids: Secondary | ICD-10-CM | POA: Diagnosis not present

## 2021-05-25 DIAGNOSIS — R079 Chest pain, unspecified: Secondary | ICD-10-CM | POA: Diagnosis present

## 2021-05-25 LAB — TROPONIN I (HIGH SENSITIVITY)
Troponin I (High Sensitivity): 2 ng/L (ref <18)
Troponin I (High Sensitivity): <2 ng/L (ref <18)

## 2021-05-25 LAB — BASIC METABOLIC PANEL
Anion gap: 7 (ref 5–15)
BUN: 12 mg/dL (ref 6–20)
CO2: 28 mmol/L (ref 22–32)
Calcium: 10 mg/dL (ref 8.9–10.3)
Chloride: 103 mmol/L (ref 98–111)
Creatinine, Ser: 1.05 mg/dL — ABNORMAL HIGH (ref 0.44–1.00)
GFR, Estimated: >60 mL/min (ref >60)
Glucose, Bld: 178 mg/dL — ABNORMAL HIGH (ref 70–99)
Potassium: 4.3 mmol/L (ref 3.5–5.1)
Sodium: 138 mmol/L (ref 135–145)

## 2021-05-25 LAB — CBC
HCT: 38 % (ref 36.0–46.0)
Hemoglobin: 12.4 g/dL (ref 12.0–15.0)
MCH: 27.5 pg (ref 26.0–34.0)
MCHC: 32.6 g/dL (ref 30.0–36.0)
MCV: 84.3 fL (ref 80.0–100.0)
Platelets: 305 10*3/uL (ref 150–400)
RBC: 4.51 MIL/uL (ref 3.87–5.11)
RDW: 12.9 % (ref 11.5–15.5)
WBC: 4.7 10*3/uL (ref 4.0–10.5)
nRBC: 0 % (ref 0.0–0.2)

## 2021-05-25 MED ORDER — DEXAMETHASONE 4 MG PO TABS
10.0000 mg | ORAL_TABLET | Freq: Once | ORAL | Status: AC
  Administered 2021-05-25: 10 mg via ORAL
  Filled 2021-05-25: qty 3

## 2021-05-25 MED ORDER — ALBUTEROL SULFATE (2.5 MG/3ML) 0.083% IN NEBU
5.0000 mg | INHALATION_SOLUTION | Freq: Once | RESPIRATORY_TRACT | Status: AC
  Administered 2021-05-25: 5 mg via RESPIRATORY_TRACT
  Filled 2021-05-25: qty 6

## 2021-05-25 MED ORDER — BENZONATATE 100 MG PO CAPS
100.0000 mg | ORAL_CAPSULE | Freq: Once | ORAL | Status: AC
Start: 2021-05-25 — End: 2021-05-25
  Administered 2021-05-25: 100 mg via ORAL
  Filled 2021-05-25: qty 1

## 2021-05-25 NOTE — ED Provider Notes (Signed)
?[facility] EMERGENCY DEPT ?Provider Note ? ? ?CSN: [id number] ?Arrival date & time: 05/25/21  1059 ? ?  ? ?History ? ?Chief Complaint  ?Patient presents with  ? Chest Pain  ? ? ?Terry Williams is a 46 y.o. female. ? ? ?Chest Pain ?Associated symptoms: cough and shortness of breath   ?Patient presents for cough and shortness of breath since yesterday.  She has a history of asthma and has been using her inhalers at home with some relief.  She endorses chest pain with coughing.  She denies any radiation of the pain.  She denies any associated nausea, vomiting, or diarrhea.  She has not had any fevers. ?HPI: A 46 year old patient presents for evaluation of chest pain. Initial onset of pain was less than one hour ago. The patient's chest pain is sharp and is not worse with exertion. The patient's chest pain is middle- or left-sided, is not well-localized, is not described as heaviness/pressure/tightness and does not radiate to the arms/jaw/neck. The patient does not complain of nausea and denies diaphoresis. The patient has a family history of coronary artery disease in a first-degree relative with onset less than age 61. The patient has no history of stroke, has no history of peripheral artery disease, has not smoked in the past 90 days, denies any history of treated diabetes, is not hypertensive, has no history of hypercholesterolemia and does not have an elevated BMI (>=30).  ? ?Home Medications ?Prior to Admission medications   ?Medication Sig Start Date End Date Taking? Authorizing Provider  ?benzonatate (TESSALON) 100 MG capsule Take 1 capsule (100 mg total) by mouth every 8 (eight) hours. 07/20/20   Domenic Moras, PA-C  ?fluticasone-salmeterol (ADVAIR HFA) [phone] MCG/ACT inhaler Inhale 2 puffs into the lungs 2 (two) times daily. 03/08/21   Ronnell Freshwater, NP  ?gabapentin (NEURONTIN) 100 MG capsule May take 1 capsule po QHS at bedtime for one week then  increase to 2 capsules po QHS 03/08/21   Ronnell Freshwater, NP  ?meloxicam (MOBIC) 15 MG tablet Take 15 mg by mouth daily. 04/19/21   [provider]  ?predniSONE (DELTASONE) 20 MG tablet Take 2 tablets (40 mg total) by mouth daily with breakfast. For the next four days 03/17/21   Carmin Muskrat, MD  ?   ? ?Allergies    ?Aspirin, Promethazine, and Naproxen   ? ?Review of Systems   ?Review of Systems  ?Respiratory:  Positive for cough, chest tightness and shortness of breath.   ?Cardiovascular:  Positive for chest pain.  ?All other systems reviewed and are negative. ? ?Physical Exam ?Updated Vital Signs ?BP 122/76 (BP Location: Right Arm)   Pulse 61   Temp 98.2 ?F (36.8 ?C) (Oral)   Resp 18   Ht 5\' 1"  (1.549 m)   Wt 54.4 kg   SpO2 100%   BMI 22.67 kg/m?  ?Physical Exam ?Vitals and nursing note reviewed.  ?Constitutional:   ?   General: She is not in acute distress. ?   Appearance: She is well-developed. She is not ill-appearing, toxic-appearing or diaphoretic.  ?HENT:  ?   Head: Normocephalic and atraumatic.  ?Eyes:  ?   Conjunctiva/sclera: Conjunctivae normal.  ?Cardiovascular:  ?   Rate and Rhythm: Normal rate and regular rhythm.  ?   Heart sounds: No murmur heard. ?Pulmonary:  ?   Effort: Pulmonary effort is normal. No respiratory distress.  ?   Breath sounds: Wheezing (Slight end expiratory) present. No decreased breath sounds, rhonchi or rales.  ?  Chest:  ?   Chest wall: Tenderness present.  ?Abdominal:  ?   Palpations: Abdomen is soft.  ?   Tenderness: There is no abdominal tenderness.  ?Musculoskeletal:     ?   General: No swelling.  ?   Cervical back: Normal range of motion and neck supple.  ?   Right lower leg: No edema.  ?   Left lower leg: No edema.  ?Skin: ?   General: Skin is warm and dry.  ?   Capillary Refill: Capillary refill takes less than 2 seconds.  ?   Coloration: Skin is not cyanotic or pale.  ?Neurological:  ?   General: No focal deficit present.  ?   Mental Status: She is alert and oriented to person, place, and time.  ?    Cranial Nerves: No cranial nerve deficit.  ?   Motor: No weakness.  ?Psychiatric:     ?   Mood and Affect: Mood normal.     ?   Behavior: Behavior normal.  ? ? ?ED Results / Procedures / Treatments   ?Labs ?(all labs ordered are listed, but only abnormal results are displayed) ?Labs Reviewed  ?BASIC METABOLIC PANEL - Abnormal; Notable for the following components:  ?    Result Value  ? Glucose, Bld 178 (*)   ? Creatinine, Ser 1.05 (*)   ? All other components within normal limits  ?CBC  ?TROPONIN I (HIGH SENSITIVITY)  ?TROPONIN I (HIGH SENSITIVITY)  ? ? ?EKG ?None ? ?Radiology ?DG Chest 2 View ? ?Result Date: 05/25/2021 ?CLINICAL DATA:  Chest pain. EXAM: CHEST - 2 VIEW COMPARISON:  November 01, 2020. FINDINGS: The heart size and mediastinal contours are within normal limits. Both lungs are clear. The visualized skeletal structures are unremarkable. IMPRESSION: No active cardiopulmonary disease. Electronically Signed   By: Marijo Conception M.D.   On: 05/25/2021 11:37   ? ?Procedures ?Procedures  ? ? ?Medications Ordered in ED ?Medications  ?albuterol (PROVENTIL) (2.5 MG/3ML) 0.083% nebulizer solution 5 mg (5 mg Nebulization Given 05/25/21 1242)  ?dexamethasone (DECADRON) tablet 10 mg (10 mg Oral Given 05/25/21 1258)  ?benzonatate (TESSALON) capsule 100 mg (100 mg Oral Given 05/25/21 1258)  ? ? ?ED Course/ Medical Decision Making/ A&P ?  ?HEAR Score: 2                       ?Medical Decision Making ?Amount and/or Complexity of Data Reviewed ?Labs: ordered. ?Radiology: ordered. ? ?Risk ?Prescription drug management. ? ? ?46 year old female presenting for cough and shortness of breath since yesterday.  She also endorses chest tightness and a middle chest pain that is present with coughing.  She has utilized inhalers at home with relief.  She has not taken anything for suppression of cough.  Differential diagnosis includes asthma exacerbation, bronchitis, pneumonia, PE, ACS.  EKG was obtained which did not show any concerns  of ST segment changes.  Based on her symptoms, risk factors, and EKG, patient's heart score is 2.  I suspect that her symptoms are secondary to her cough.  On exam, she does have a slight end of the release.  Presentation is consistent with asthma exacerbation.  Patient was given Decadron and nebulized albuterol with resolution of symptoms.  Lab results, including troponins x2, are normal.  Chest x-ray shows no acute findings.  Given her improved symptoms and reassuring work-up, patient is stable for discharge at this time. ? ? ? ? ? ? ? ?Final Clinical  Impression(s) / ED Diagnoses ?Final diagnoses:  ?Bronchitis  ?Mild intermittent asthma with exacerbation  ? ? ?Rx / DC Orders ?ED Discharge Orders   ? ? None  ? ?  ? ? ?  ?Godfrey Pick, MD ?05/26/21 [phone] ? ?

## 2021-05-25 NOTE — ED Triage Notes (Signed)
Patient reports to the ER for sharp pains in her chest. Patient reports a hx of bronchitis and reports this may be related.  ?

## 2021-07-22 ENCOUNTER — Emergency Department (HOSPITAL_COMMUNITY)
Admission: EM | Admit: 2021-07-22 | Discharge: 2021-07-22 | Discharge status: Against Medical Advice | Payer: 59 | Attending: Emergency Medicine | Admitting: Emergency Medicine

## 2021-07-22 ENCOUNTER — Other Ambulatory Visit: Payer: Self-pay

## 2021-07-22 ENCOUNTER — Ambulatory Visit (HOSPITAL_COMMUNITY)
Admission: RE | Admit: 2021-07-22 | Discharge: 2021-07-22 | Disposition: A | Discharge status: Home / Self Care | Payer: 59 | Source: Ambulatory Visit | Attending: Emergency Medicine | Admitting: Emergency Medicine

## 2021-07-22 ENCOUNTER — Ambulatory Visit (HOSPITAL_COMMUNITY)
Admission: EM | Admit: 2021-07-22 | Discharge: 2021-07-22 | Disposition: A | Discharge status: Home / Self Care | Payer: 59 | Attending: Emergency Medicine | Admitting: Emergency Medicine

## 2021-07-22 ENCOUNTER — Encounter (HOSPITAL_COMMUNITY): Payer: Self-pay

## 2021-07-22 ENCOUNTER — Encounter (HOSPITAL_COMMUNITY): Payer: Self-pay | Admitting: Emergency Medicine

## 2021-07-22 DIAGNOSIS — Z5321 Procedure and treatment not carried out due to patient leaving prior to being seen by health care provider: Secondary | ICD-10-CM | POA: Diagnosis not present

## 2021-07-22 DIAGNOSIS — F329 Major depressive disorder, single episode, unspecified: Secondary | ICD-10-CM | POA: Insufficient documentation

## 2021-07-22 DIAGNOSIS — M79642 Pain in left hand: Secondary | ICD-10-CM

## 2021-07-22 DIAGNOSIS — R45851 Suicidal ideations: Secondary | ICD-10-CM | POA: Insufficient documentation

## 2021-07-22 DIAGNOSIS — M25532 Pain in left wrist: Secondary | ICD-10-CM | POA: Diagnosis not present

## 2021-07-22 DIAGNOSIS — M7918 Myalgia, other site: Secondary | ICD-10-CM | POA: Diagnosis not present

## 2021-07-22 DIAGNOSIS — S63502A Unspecified sprain of left wrist, initial encounter: Secondary | ICD-10-CM

## 2021-07-22 NOTE — ED Triage Notes (Addendum)
Pt c/o pain in left hand/wrist x 1 month. States pain has worsened recently.  Pt denies injury.  Pt states she has suicidal ideations, but feels like it is her usual depression.

## 2021-07-22 NOTE — Discharge Instructions (Signed)
Rest,ice, wear ace wrap or wrist brace of your choice for support. Your xrays today at ER were both negative. Take otc med for pain management(tylenol,voltaren gel,lidocaine cream,etc). Follow up with PCP ,if symptoms persist follow up with Orthopedist.

## 2021-07-22 NOTE — ED Notes (Signed)
Called for patient again and no answer.

## 2021-07-22 NOTE — ED Triage Notes (Signed)
Pt is present today with left wrist pain. Pt states that the pain started one month ago. Pt denies any injury

## 2021-07-22 NOTE — ED Provider Notes (Signed)
[facility]    CSN: [id number] Arrival date & time: 07/22/21  0806      History   Chief Complaint Chief Complaint  Patient presents with   Wrist Pain    HPI Terry Williams is a 46 y.o. female.   46 year old female pt, Terry Williams presents to Urgent Care with chief complaint of left wrist/hand pain x 1 month. No trauma or injury. Pt was in ER PTA had xrays done, left ER and is now checked in at Urgent Care. Xrays were negative from today's ER visit.   The history is provided by the patient. No language interpreter was used.    Past Medical History:  Diagnosis Date   Asthma     Patient Active Problem List   Diagnosis Date Noted   Sprain of left wrist 07/22/2021   Left hand pain 07/22/2021   Left wrist pain 07/22/2021   Musculoskeletal pain 07/22/2021   Osteoarthritis of spine with radiculopathy, cervical region 03/14/2021   Mild intermittent asthma without complication 03/14/2021    Past Surgical History:  Procedure Laterality Date   TUBAL LIGATION      OB History   No obstetric history on file.      Home Medications    Prior to Admission medications   Medication Sig Start Date End Date Taking? Authorizing Provider  benzonatate (TESSALON) 100 MG capsule Take 1 capsule (100 mg total) by mouth every 8 (eight) hours. 07/20/20   Fayrene Helper, PA-C  fluticasone-salmeterol (ADVAIR HFA) [phone] MCG/ACT inhaler Inhale 2 puffs into the lungs 2 (two) times daily. 03/08/21   Carlean Jews, NP  gabapentin (NEURONTIN) 100 MG capsule May take 1 capsule po QHS at bedtime for one week then  increase to 2 capsules po QHS 03/08/21   Carlean Jews, NP  meloxicam (MOBIC) 15 MG tablet Take 15 mg by mouth daily. 04/19/21   [provider]  predniSONE (DELTASONE) 20 MG tablet Take 2 tablets (40 mg total) by mouth daily with breakfast. For the next four days 03/17/21   Gerhard Munch, MD    Family History History reviewed. No pertinent family  history.  Social History Social History   Tobacco Use   Smoking status: Never   Smokeless tobacco: Never  Vaping Use   Vaping Use: Never used  Substance Use Topics   Alcohol use: Not Currently   Drug use: Never     Allergies   Aspirin, Promethazine, and Naproxen   Review of Systems Review of Systems  Constitutional:  Negative for fever.  Musculoskeletal:  Positive for arthralgias and myalgias. Negative for back pain, gait problem, joint swelling, neck pain and neck stiffness.  All other systems reviewed and are negative.    Physical Exam Triage Vital Signs ED Triage Vitals  Enc Vitals Group     BP 07/22/21 0855 (!) 120/57     Pulse Rate 07/22/21 0855 78     Resp 07/22/21 0855 17     Temp 07/22/21 0855 98.2 F (36.8 C)     Temp src --      SpO2 07/22/21 0855 98 %     Weight --      Height --      Head Circumference --      Peak Flow --      Pain Score 07/22/21 0857 10     Pain Loc --      Pain Edu? --      Excl. in GC? --  No data found.  Updated Vital Signs BP (!) 120/57   Pulse 78   Temp 98.2 F (36.8 C)   Resp 17   LMP  (LMP Unknown)   SpO2 98%   Visual Acuity Right Eye Distance:   Left Eye Distance:   Bilateral Distance:    Right Eye Near:   Left Eye Near:    Bilateral Near:     Physical Exam Vitals and nursing note reviewed.  Constitutional:      General: She is not in acute distress.    Appearance: She is well-developed and well-groomed.  HENT:     Head: Normocephalic and atraumatic.  Eyes:     Conjunctiva/sclera: Conjunctivae normal.  Cardiovascular:     Rate and Rhythm: Normal rate and regular rhythm.     Heart sounds: No murmur heard. Pulmonary:     Effort: Pulmonary effort is normal. No respiratory distress.     Breath sounds: Normal breath sounds.  Abdominal:     Palpations: Abdomen is soft.     Tenderness: There is no abdominal tenderness.  Musculoskeletal:        General: No swelling.     Left wrist: Tenderness  present.       Arms:     Cervical back: Neck supple.  Skin:    General: Skin is warm and dry.     Capillary Refill: Capillary refill takes less than 2 seconds.  Neurological:     General: No focal deficit present.     Mental Status: She is alert and oriented to person, place, and time.     GCS: GCS eye subscore is 4. GCS verbal subscore is 5. GCS motor subscore is 6.  Psychiatric:        Attention and Perception: Attention normal.        Mood and Affect: Mood normal.        Speech: Speech normal.        Behavior: Behavior normal. Behavior is cooperative.      UC Treatments / Results  Labs (all labs ordered are listed, but only abnormal results are displayed) Labs Reviewed - No data to display  EKG   Radiology DG Wrist Complete Left  Result Date: 07/22/2021 CLINICAL DATA:  Left hand and wrist trauma. EXAM: LEFT WRIST - COMPLETE 3+ VIEW; LEFT HAND - COMPLETE 3+ VIEW COMPARISON:  None Available. FINDINGS: Left hand: There is no evidence of fracture or dislocation. There is no evidence of arthropathy or other focal bone abnormality. Soft tissues are unremarkable. Left wrist: There is normal bone mineralization. There is no evidence of fracture, dislocation or arthritic changes. Soft tissues are unremarkable. IMPRESSION: No evidence of fractures. Electronically Signed   By: Almira Bar M.D.   On: 07/22/2021 07:16   DG Hand Complete Left  Result Date: 07/22/2021 CLINICAL DATA:  Left hand and wrist trauma. EXAM: LEFT WRIST - COMPLETE 3+ VIEW; LEFT HAND - COMPLETE 3+ VIEW COMPARISON:  None Available. FINDINGS: Left hand: There is no evidence of fracture or dislocation. There is no evidence of arthropathy or other focal bone abnormality. Soft tissues are unremarkable. Left wrist: There is normal bone mineralization. There is no evidence of fracture, dislocation or arthritic changes. Soft tissues are unremarkable. IMPRESSION: No evidence of fractures. Electronically Signed   By: Almira Bar M.D.   On: 07/22/2021 07:16    Procedures Procedures (including critical care time)  Medications Ordered in UC Medications - No data to display  Initial Impression /  Assessment and Plan / UC Course  I have reviewed the triage vital signs and the nursing notes.  Pertinent labs & imaging results that were available during my care of the patient were reviewed by me and considered in my medical decision making (see chart for details).     Ddx: Wrist/hand pain, sprain, strain Final Clinical Impressions(s) / UC Diagnoses   Final diagnoses:  Left hand pain  Left wrist pain  Musculoskeletal pain     Discharge Instructions      Rest,ice, wear ace wrap or wrist brace of your choice for support. Your xrays today at ER were both negative. Take otc med for pain management(tylenol,voltaren gel,lidocaine cream,etc). Follow up with PCP ,if symptoms persist follow up with Orthopedist.      ED Prescriptions   None    PDMP not reviewed this encounter.   Clancy Gourd, NP 07/22/21 1006

## 2021-07-22 NOTE — ED Notes (Signed)
Pt was called to bring back to triage for SI and to change into scrubs. Unable to locate pt att. Called xray and pt not there.

## 2022-08-12 IMAGING — DX DG CHEST 2V
2 series · 2 of 2 positions shown · non-contrast
Comparison: November 01, 2020.

CLINICAL DATA: Chest pain.

EXAM:
CHEST - 2 VIEW

[chest pa]
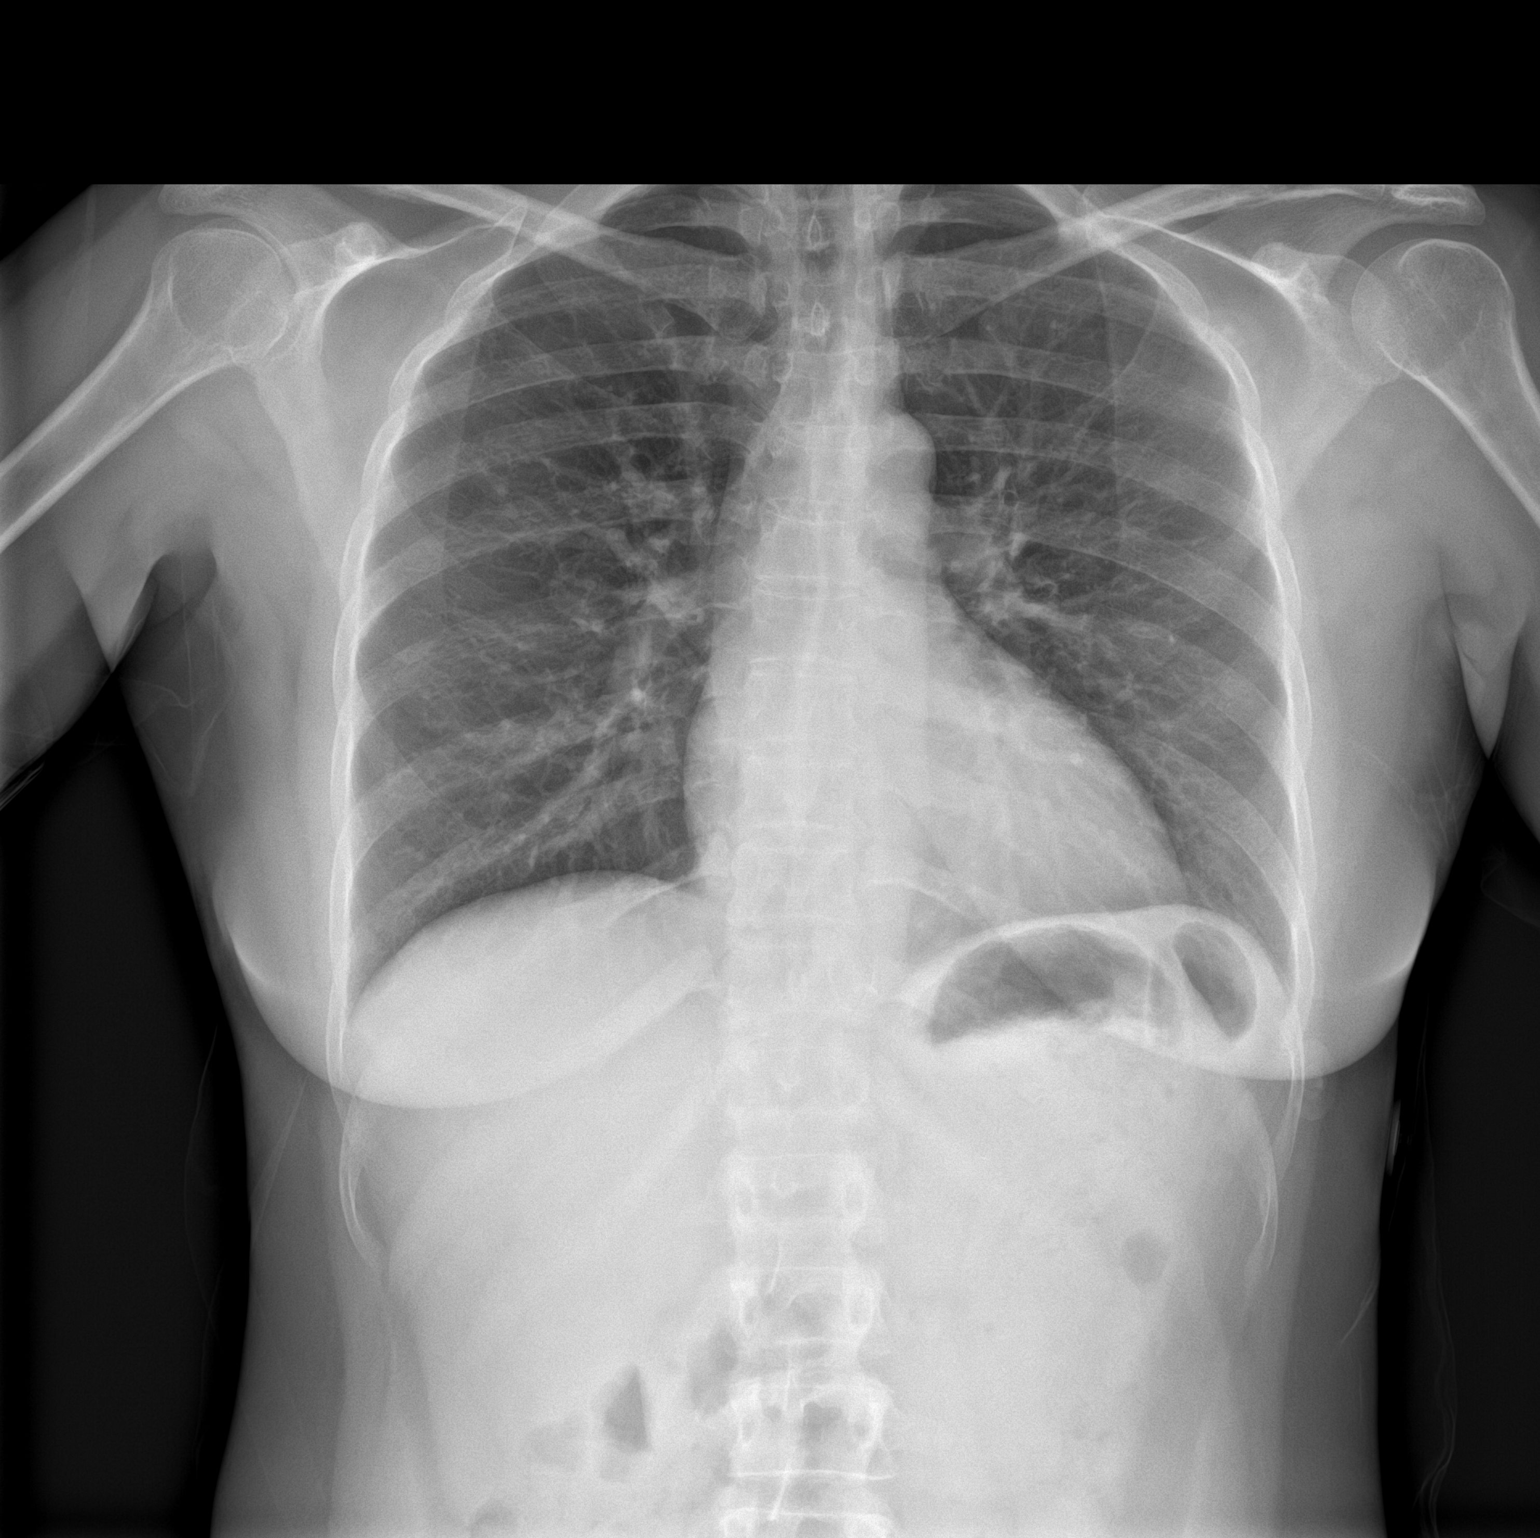

[chest lat]
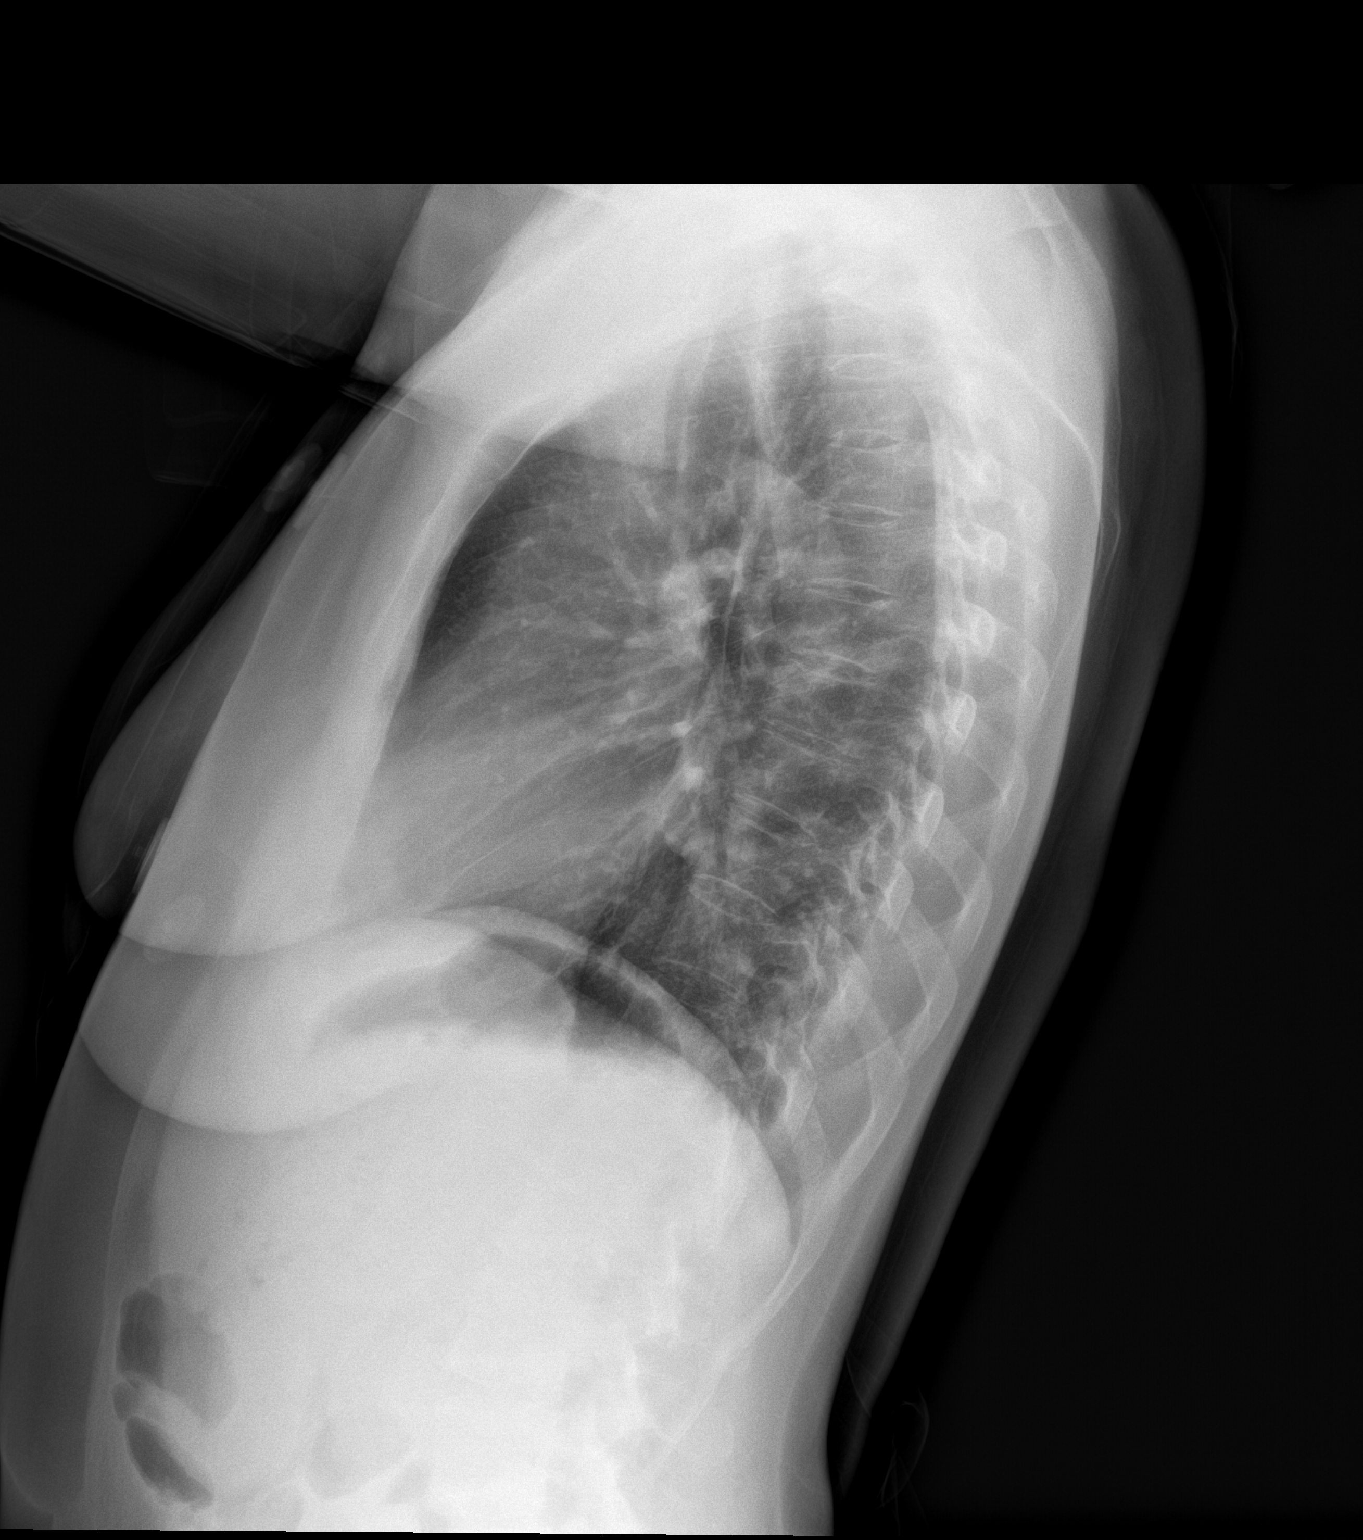

[2 of 2 positions shown; findings below may reference images not displayed]

FINDINGS: The heart size and mediastinal contours are within normal limits.
Both lungs are clear. The visualized skeletal structures are
unremarkable.
IMPRESSION: No active cardiopulmonary disease.
